# Patient Record
Sex: Female | Born: 2005 | Race: Black or African American | Hispanic: No | Marital: Single | Smoking: Never smoker
Health system: Southern US, Community
[De-identification: ages and names within clinical notes are randomized; demographics above are authoritative.]

## PROBLEM LIST (undated history)

## (undated) DIAGNOSIS — J45909 Unspecified asthma, uncomplicated: Secondary | ICD-10-CM

---
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
## 2023-02-01 ENCOUNTER — Other Ambulatory Visit: Payer: Self-pay | Admitting: Obstetrics & Gynecology

## 2023-02-01 DIAGNOSIS — Z3042 Encounter for surveillance of injectable contraceptive: Secondary | ICD-10-CM

## 2023-02-09 ENCOUNTER — Ambulatory Visit: Payer: Medicaid Other

## 2023-02-09 VITALS — BP 122/76 | HR 86 | Ht 63.0 in | Wt 148.1 lb

## 2023-02-09 DIAGNOSIS — Z3042 Encounter for surveillance of injectable contraceptive: Secondary | ICD-10-CM

## 2023-02-09 MED ORDER — MEDROXYPROGESTERONE ACETATE 150 MG/ML IM SUSY
150.0000 mg | PREFILLED_SYRINGE | Freq: Once | INTRAMUSCULAR | Status: AC
Start: 2023-02-09 — End: 2023-02-09
  Administered 2023-02-09: 150 mg via INTRAMUSCULAR

## 2023-02-09 MED ORDER — MEDROXYPROGESTERONE ACETATE 150 MG/ML IM SUSP
150.0000 mg | INTRAMUSCULAR | 6 refills | Status: AC
Start: 2023-02-09 — End: ?

## 2023-02-09 NOTE — Progress Notes (Signed)
Patient was assessed and managed by nursing staff during this encounter. I have reviewed the chart and agree with the documentation and plan. I have also made any necessary editorial changes.  Warden Fillers, MD 02/09/2023 9:46 AM

## 2023-02-09 NOTE — Progress Notes (Signed)
Date last pap: N/A Last Depo-Provera: 11/12/2022 Side Effects if any: None noted. Serum HCG indicated? N/A Depo-Provera 150 mg IM given by: Philemon Kingdom, CMA Next appointment due: Oct 1st - Oct. 17th.  **Office stock given and refills sent.

## 2023-04-19 ENCOUNTER — Encounter (HOSPITAL_COMMUNITY): Payer: Self-pay | Admitting: *Deleted

## 2023-04-19 ENCOUNTER — Emergency Department (HOSPITAL_COMMUNITY)
Admission: EM | Admit: 2023-04-19 | Discharge: 2023-04-19 | Discharge status: Against Medical Advice | Payer: Medicaid Other | Attending: Emergency Medicine | Admitting: Emergency Medicine

## 2023-04-19 ENCOUNTER — Other Ambulatory Visit: Payer: Self-pay

## 2023-04-19 DIAGNOSIS — Z5321 Procedure and treatment not carried out due to patient leaving prior to being seen by health care provider: Secondary | ICD-10-CM | POA: Insufficient documentation

## 2023-04-19 DIAGNOSIS — Y92410 Unspecified street and highway as the place of occurrence of the external cause: Secondary | ICD-10-CM | POA: Diagnosis not present

## 2023-04-19 DIAGNOSIS — R519 Headache, unspecified: Secondary | ICD-10-CM | POA: Insufficient documentation
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
## 2023-05-03 ENCOUNTER — Ambulatory Visit: Payer: Medicaid Other

## 2023-05-11 ENCOUNTER — Ambulatory Visit: Payer: Medicaid Other

## 2023-05-11 VITALS — BP 119/77 | HR 77 | Wt 147.9 lb

## 2023-05-11 DIAGNOSIS — Z3042 Encounter for surveillance of injectable contraceptive: Secondary | ICD-10-CM | POA: Diagnosis not present

## 2023-05-11 NOTE — Progress Notes (Signed)
Pt is in the office for depo injection. Administered in [place] and pt tolerated well. Next due Dec 31- Jan 14 .Marland Kitchen Administrations This Visit     medroxyPROGESTERone (DEPO-PROVERA) injection 150 mg     Admin Date 05/11/2023 Action Given Dose 150 mg Route Intramuscular Documented By Katrina Stack, RN

## 2023-07-29 ENCOUNTER — Ambulatory Visit: Payer: Medicaid Other

## 2023-07-29 ENCOUNTER — Encounter: Payer: Self-pay | Admitting: Obstetrics and Gynecology

## 2023-07-29 VITALS — BP 108/72 | HR 78 | Wt 154.0 lb

## 2023-07-29 DIAGNOSIS — Z3042 Encounter for surveillance of injectable contraceptive: Secondary | ICD-10-CM | POA: Diagnosis not present

## 2023-07-29 NOTE — Progress Notes (Signed)
 Pt is in the office for depo injection. Administered in R [place] and pt tolerated well. Next due March 20- April 3 .. Administrations This Visit     medroxyPROGESTERone  (DEPO-PROVERA ) injection 150 mg     Admin Date 07/29/2023 Action Given Dose 150 mg Route Intramuscular Documented By Doneta Laymon BIRCH, RN

## 2023-10-17 NOTE — Progress Notes (Deleted)
 ANNUAL EXAM Patient name: Kristin Monroe MRN [id number]  Date of birth: 2005/12/16 Chief Complaint:   No chief complaint on file.  History of Present Illness:   Kristin Monroe is a 18 y.o. G0P0000 {race:25618} female being seen today for a routine annual exam.  Current complaints: ***  No LMP recorded. Patient has had an injection.   The pregnancy intention screening data noted above was reviewed. Potential methods of contraception were discussed. The patient elected to proceed with No data recorded.   Last pap Not yet indicated due to age.  Last mammogram: Not yet indicated. Family h/o breast cancer: {yes***/no:23838} Last colonoscopy: Not yet indicated. Family h/o colorectal cancer: {yes***/no:23838} STI screening:  Contraception:      03/26/2022    1:42 PM  Depression screen PHQ 2/9  Decreased Interest 0  Down, Depressed, Hopeless 0  PHQ - 2 Score 0         No data to display           Review of Systems:   Pertinent items are noted in HPI Denies any headaches, blurred vision, fatigue, shortness of breath, chest pain, abdominal pain, abnormal vaginal discharge/itching/odor/irritation, problems with periods, bowel movements, urination, or intercourse unless otherwise stated above. Pertinent History Reviewed:  Reviewed past medical,surgical, social and family history.  Reviewed problem list, medications and allergies. Physical Assessment:  There were no vitals filed for this visit.There is no height or weight on file to calculate BMI.        Physical Examination:   General appearance - well appearing, and in no distress  Mental status - alert, oriented to person, place, and time  Psych:  She has a normal mood and affect  Skin - warm and dry, normal color, no suspicious lesions noted  Chest - effort normal, all lung fields clear to auscultation bilaterally  Heart - normal rate and regular rhythm  Neck:  midline trachea, no thyromegaly or nodules  Breasts - breasts  appear normal, no suspicious masses, no skin or nipple changes or  axillary nodes  Abdomen - soft, nontender, nondistended, no masses or organomegaly  Pelvic - VULVA: normal appearing vulva with no masses, tenderness or lesions  VAGINA: normal appearing vagina with normal color and discharge, no lesions  CERVIX: normal appearing cervix without discharge or lesions, no CMT  Thin prep pap is {Desc; done/not:10129} *** HR HPV cotesting  UTERUS: uterus is felt to be normal size, shape, consistency and nontender   ADNEXA: No adnexal masses or tenderness noted.  Extremities:  No swelling or varicosities noted  Chaperone present for exam  No results found for this or any previous visit (from the past 24 hours).  Assessment & Plan:  1. Encounter for annual routine gynecological examination (Primary) - Cervical cancer screening: Discussed purpose of pap and recommended first pap at age 26. Discussed screening Q3 years. Reviewed importance of annual exams and limits of pap smear. - GC/CT: Discussed and recommended. Pt  {Blank single:19197::"accepts","declines"} - Gardasil:  {Blank single:19197::"***","has not yet had. Will provide information","completed","has not yet had. Counseling provided and she declines","Has not yet had. Counseling provided and pt accepts"} - Birth Control: {Birth control type:23956} - Breast Health: Encouraged self breast awareness - Mammogram: {Mammo f/u:25212::"@ 20yo"}, or sooner if problems - Colonoscopy: {TCS f/u:25213::"@ 20yo"}, or sooner if problems - Follow-up: 12 months and prn   No orders of the defined types were placed in this encounter.  Meds: No orders of the defined types were placed  in this encounter.   Follow-up: No follow-ups on file.  Ralene Muskrat, [place] 10/17/2023 5:57 PM

## 2023-10-20 ENCOUNTER — Ambulatory Visit: Payer: Medicaid Other | Admitting: Physician Assistant

## 2023-10-20 DIAGNOSIS — Z01419 Encounter for gynecological examination (general) (routine) without abnormal findings: Secondary | ICD-10-CM

## 2023-10-21 ENCOUNTER — Ambulatory Visit: Payer: Medicaid Other

## 2023-10-21 ENCOUNTER — Ambulatory Visit: Payer: Medicaid Other | Admitting: Obstetrics and Gynecology
